# Patient Record
Sex: Male | Born: 2000 | Race: Black or African American | Hispanic: No | Marital: Single | Smoking: Current some day smoker
Health system: Southern US, Community
[De-identification: ages and names within clinical notes are randomized; demographics above are authoritative.]

---
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
## 2018-03-24 IMAGING — DX DG HAND COMPLETE 3+V*L*
3 series · 3 of 3 positions shown · non-contrast
Comparison: None.

CLINICAL DATA: Left fifth metacarpal pain, punching injury 2 days
ago

EXAM:
LEFT HAND - COMPLETE 3+ VIEW

[hand pa]
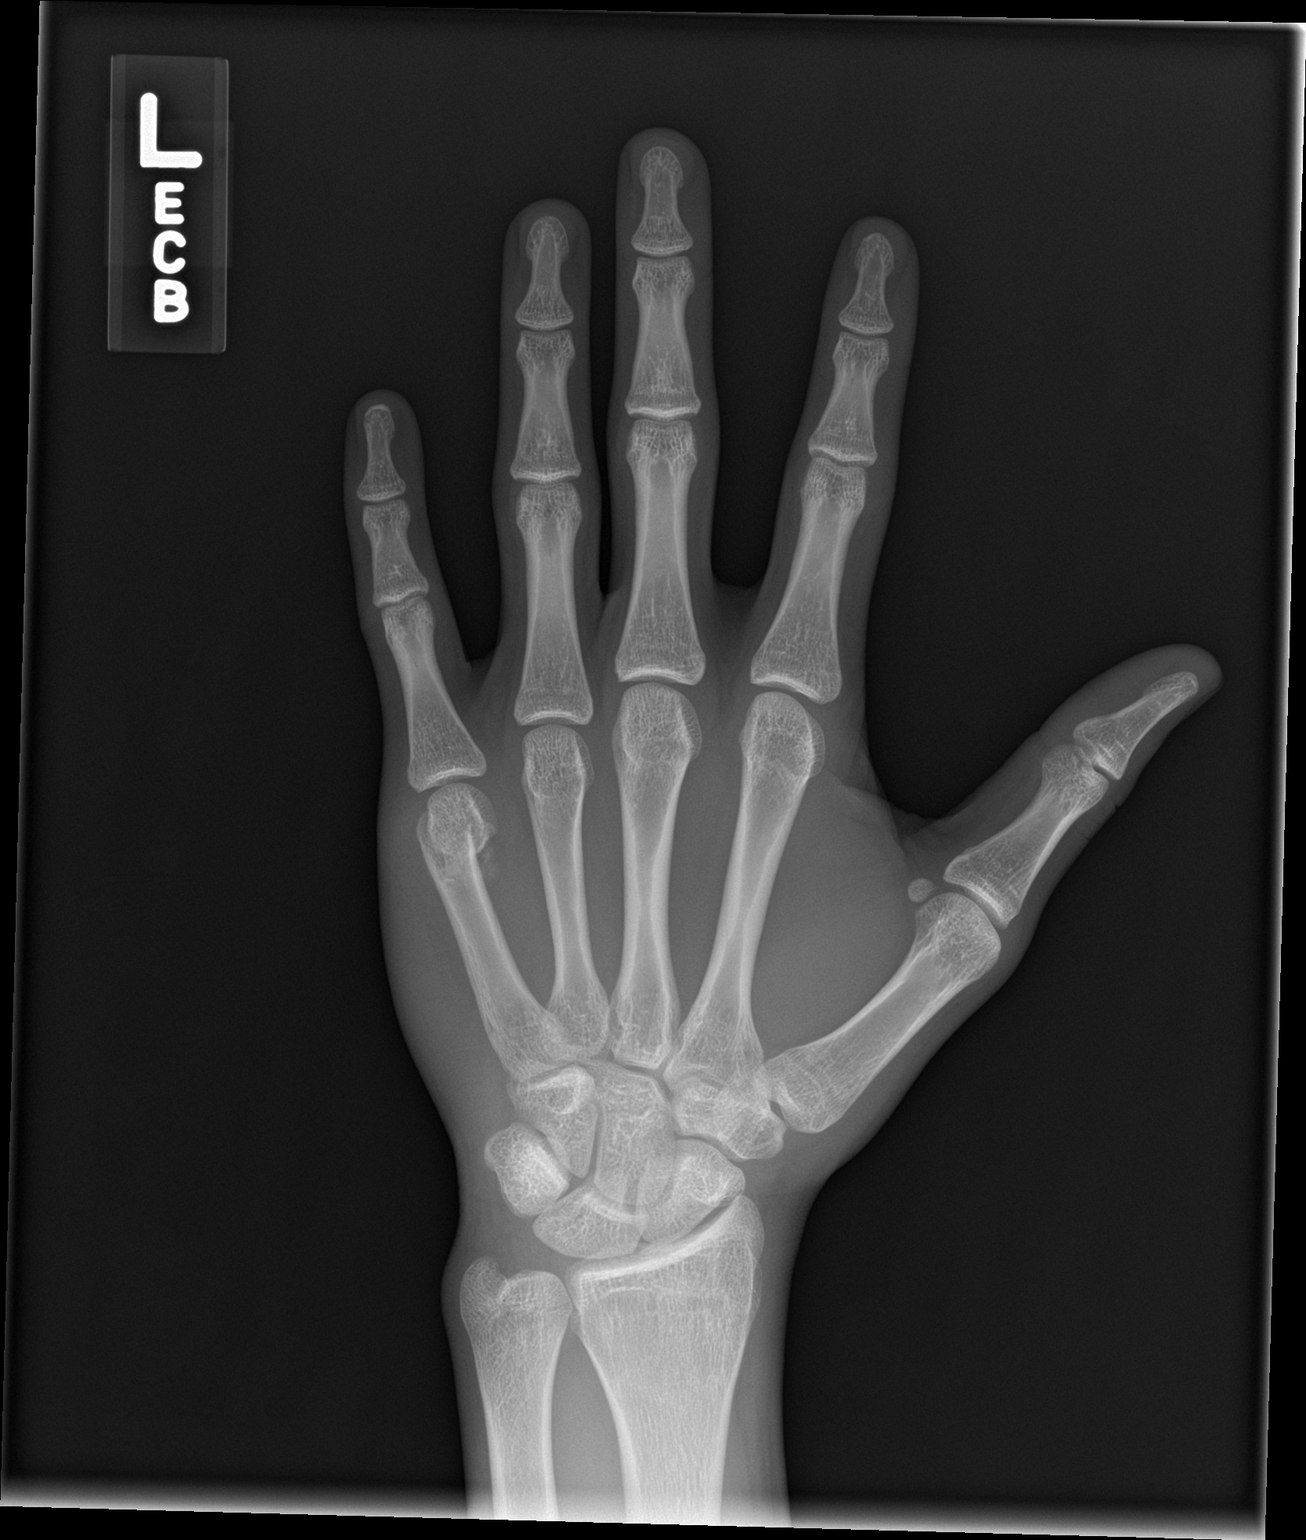

[hand obl]
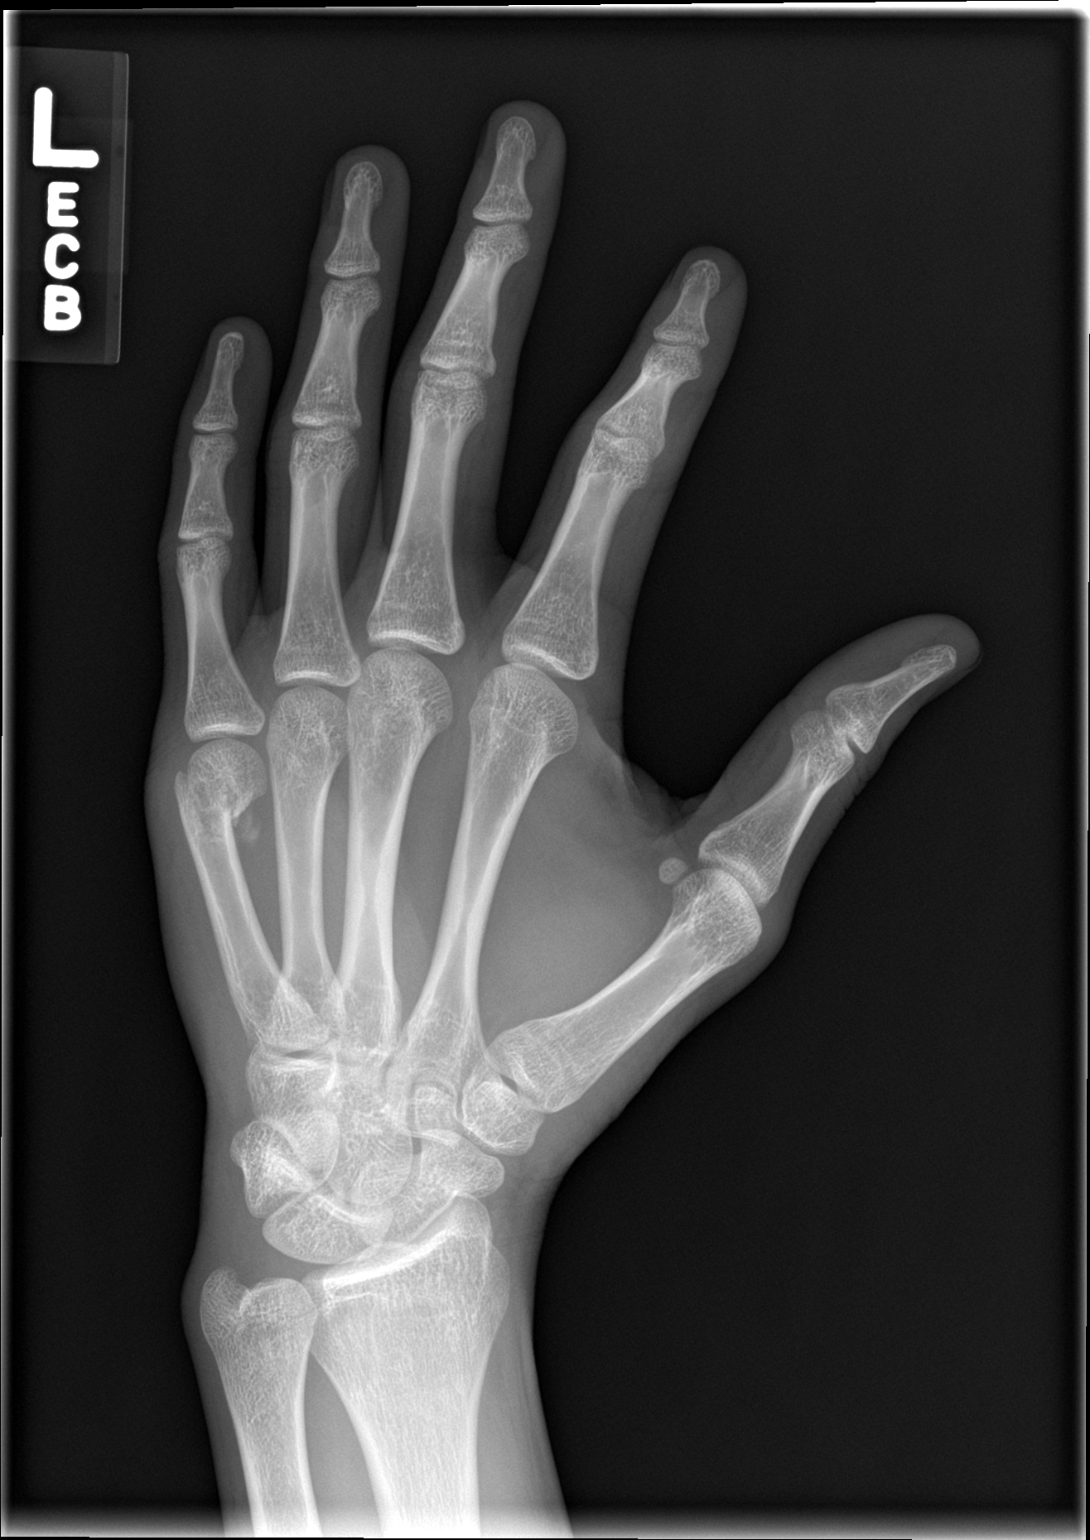

[hand lat]
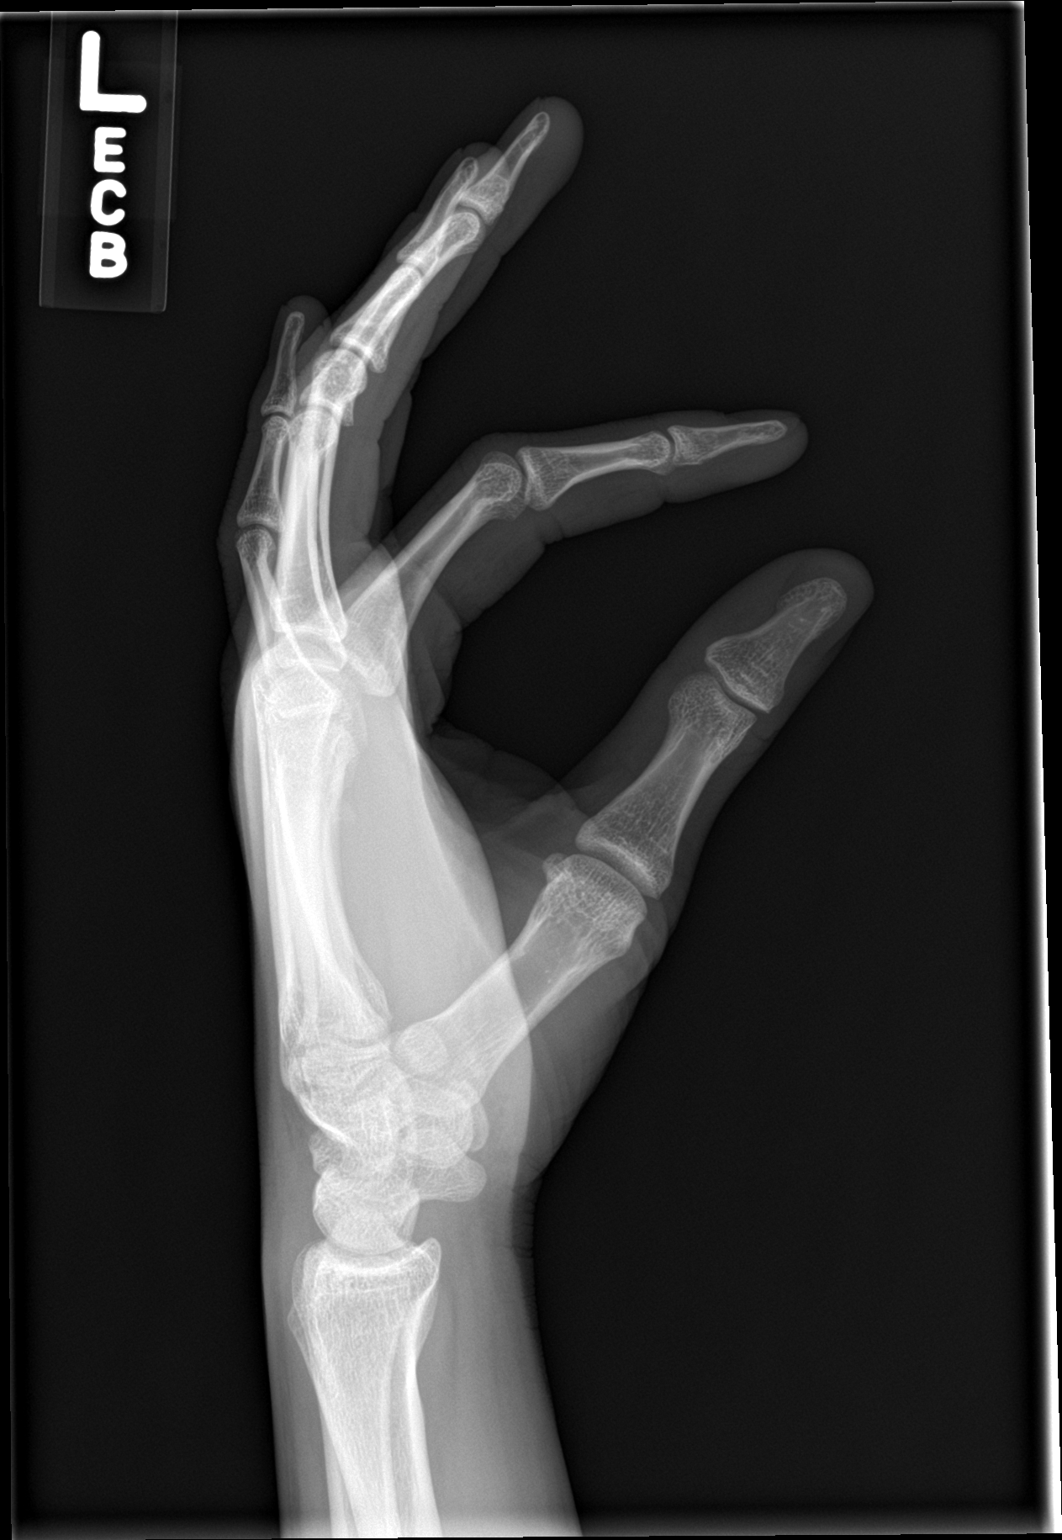

[3 of 3 positions shown; findings below may reference images not displayed]

FINDINGS: Three views of the left hand submitted. There is fracture deformity
in distal left fifth metacarpal of indeterminate age. Given adjacent
callus formation and no visible fracture line, this might be old.
Clinical correlation is necessary.
IMPRESSION: There is fracture deformity in distal left fifth metacarpal of
indeterminate age. Given adjacent callus formation and no visible
fracture line, this might be old. Clinical correlation is necessary.
These results were called by telephone at the time of interpretation
on 07/13/2016 at [DATE] to Dr. ABIMELK TIGER , who verbally
acknowledged these results.
